# Patient Record
Sex: Male | Born: 1965 | Hispanic: No | Marital: Married | Smoking: Current every day smoker
Health system: Southern US, Community
[De-identification: ages and names within clinical notes are randomized; demographics above are authoritative.]

## PROBLEM LIST (undated history)

## (undated) DIAGNOSIS — L409 Psoriasis, unspecified: Secondary | ICD-10-CM

## (undated) HISTORY — PX: BACK SURGERY: SHX140

---

## 2020-06-10 ENCOUNTER — Other Ambulatory Visit: Payer: Self-pay

## 2020-06-10 ENCOUNTER — Other Ambulatory Visit (HOSPITAL_BASED_OUTPATIENT_CLINIC_OR_DEPARTMENT_OTHER): Payer: Self-pay | Admitting: Emergency Medicine

## 2020-06-10 ENCOUNTER — Emergency Department (HOSPITAL_BASED_OUTPATIENT_CLINIC_OR_DEPARTMENT_OTHER)
Admission: EM | Admit: 2020-06-10 | Discharge: 2020-06-10 | Disposition: A | Discharge status: Home / Self Care | Payer: 59 | Attending: Emergency Medicine | Admitting: Emergency Medicine

## 2020-06-10 ENCOUNTER — Emergency Department (HOSPITAL_BASED_OUTPATIENT_CLINIC_OR_DEPARTMENT_OTHER): Payer: 59

## 2020-06-10 ENCOUNTER — Encounter (HOSPITAL_BASED_OUTPATIENT_CLINIC_OR_DEPARTMENT_OTHER): Payer: Self-pay | Admitting: *Deleted

## 2020-06-10 DIAGNOSIS — F17210 Nicotine dependence, cigarettes, uncomplicated: Secondary | ICD-10-CM | POA: Insufficient documentation

## 2020-06-10 DIAGNOSIS — M5459 Other low back pain: Secondary | ICD-10-CM | POA: Diagnosis present

## 2020-06-10 DIAGNOSIS — N200 Calculus of kidney: Secondary | ICD-10-CM | POA: Diagnosis not present

## 2020-06-10 HISTORY — DX: Psoriasis, unspecified: L40.9

## 2020-06-10 LAB — URINALYSIS, ROUTINE W REFLEX MICROSCOPIC
Bilirubin Urine: NEGATIVE
Glucose, UA: NEGATIVE mg/dL
Ketones, ur: NEGATIVE mg/dL
Leukocytes,Ua: NEGATIVE
Nitrite: NEGATIVE
Protein, ur: NEGATIVE mg/dL
Specific Gravity, Urine: >1.03 — ABNORMAL HIGH (ref 1.005–1.030)
pH: 6 (ref 5.0–8.0)

## 2020-06-10 LAB — URINALYSIS, MICROSCOPIC (REFLEX)

## 2020-06-10 MED ORDER — HYDROCODONE-ACETAMINOPHEN 5-325 MG PO TABS: 1.0000 | ORAL_TABLET | Freq: Four times a day (QID) | ORAL | 0 refills | Status: DC | PRN

## 2020-06-10 MED ORDER — TAMSULOSIN HCL 0.4 MG PO CAPS
0.4000 mg | ORAL_CAPSULE | Freq: Every day | ORAL | 0 refills | Status: DC
Start: 2020-06-10 — End: 2020-06-10

## 2020-06-10 MED ORDER — KETOROLAC TROMETHAMINE 30 MG/ML IJ SOLN
15.0000 mg | Freq: Once | INTRAMUSCULAR | Status: DC
  Filled 2020-06-10: qty 1

## 2020-06-10 MED ORDER — TRAMADOL HCL 50 MG PO TABS
50.0000 mg | ORAL_TABLET | Freq: Once | ORAL | Status: AC
  Administered 2020-06-10: 50 mg via ORAL
  Filled 2020-06-10: qty 1

## 2020-06-10 MED ORDER — KETOROLAC TROMETHAMINE 30 MG/ML IJ SOLN
30.0000 mg | Freq: Once | INTRAMUSCULAR | Status: AC
  Administered 2020-06-10: 30 mg via INTRAMUSCULAR

## 2020-06-10 MED FILL — HYDROCODON-APAP 5-325: 5-325 | 2 days supply | Qty: 15 | Fill #0

## 2020-06-10 MED FILL — TAMSULOSIN HCL 0.4 MG CAP: 0.4 | 10 days supply | Qty: 10 | Fill #0

## 2020-06-10 NOTE — ED Notes (Signed)
ED Provider at bedside. 

## 2020-06-10 NOTE — ED Provider Notes (Signed)
[facility] EMERGENCY DEPARTMENT Provider Note   CSN: [id number] Arrival date & time: 06/10/20  7893     History Chief Complaint  Patient presents with  . Flank pain    James Warner is a 54 y.o. male.  Patient is a 54 year old male who presents with right lower back pain.  He said that he has had it for about a year intermittently.  It usually stays in his right lower back.  Since yesterday has been radiating a little bit more around to his flank area.  He describes it as a knife stabbing him in the back.  It is gotten much more intense since yesterday.  He denies any associated nausea or vomiting.  No abdominal pain other than the radiation to his flank.  No radiation down his legs.  This morning he did notice a little bit of numbness in his right foot but denies any numbness now.  No weakness in the legs.  No loss of bowel or bladder function.  No fevers.  No urinary symptoms although he says his urine is dark and has a bit of an odor.  He says its not really worse with movement.  He has been using Tylenol with some improvement in symptoms although since he got worse yesterday it has not really been helping.  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.  He originally is from [place].        Past Medical History:  Diagnosis Date  . Psoriasis     There are no problems to display for this patient.   Past Surgical History:  Procedure Laterality Date  . BACK SURGERY         History reviewed. No pertinent family history.  Social History   Tobacco Use  . Smoking status: Current Every Day Smoker    Types: Cigarettes  . Smokeless tobacco: Never Used  Substance Use Topics  . Alcohol use: Not Currently    Comment: quit 16 years ago  . Drug use: Never    Home Medications Prior to Admission medications   Medication Sig Start Date End Date Taking? Authorizing Provider  HYDROcodone-acetaminophen (NORCO/VICODIN) 5-325 MG  tablet Take 1-2 tablets by mouth every 6 (six) hours as needed. 06/10/20   Rolan Bucco, MD  tamsulosin (FLOMAX) 0.4 MG CAPS capsule Take 1 capsule (0.4 mg total) by mouth daily. 06/10/20   Rolan Bucco, MD    Allergies    Patient has no known allergies.  Review of Systems   Review of Systems  Constitutional: Negative for chills, diaphoresis, fatigue and fever.  HENT: Negative for congestion, rhinorrhea and sneezing.   Eyes: Negative.   Respiratory: Negative for cough, chest tightness and shortness of breath.   Cardiovascular: Negative for chest pain and leg swelling.  Gastrointestinal: Negative for abdominal pain, blood in stool, diarrhea, nausea and vomiting.  Genitourinary: Positive for flank pain. Negative for difficulty urinating, frequency and hematuria.  Musculoskeletal: Positive for back pain. Negative for arthralgias.  Skin: Negative for rash.  Neurological: Negative for dizziness, speech difficulty, weakness, numbness and headaches.    Physical Exam Updated Vital Signs BP 128/83 (BP Location: Right Arm)   Pulse 75   Temp 98.1 F (36.7 C) (Oral)   Resp 18   Ht 5\' 6"  (1.676 m)   Wt 74.8 kg   SpO2 98%   BMI 26.63 kg/m   Physical Exam Constitutional:      Appearance: He is well-developed.  HENT:     Head: Normocephalic and atraumatic.  Eyes:     Pupils: Pupils are equal, round, and reactive to light.  Cardiovascular:     Rate and Rhythm: Normal rate and regular rhythm.     Heart sounds: Normal heart sounds.  Pulmonary:     Effort: Pulmonary effort is normal. No respiratory distress.     Breath sounds: Normal breath sounds. No wheezing or rales.  Chest:     Chest wall: No tenderness.  Abdominal:     General: Bowel sounds are normal.     Palpations: Abdomen is soft.     Tenderness: There is no abdominal tenderness. There is no guarding or rebound.  Musculoskeletal:        General: Normal range of motion.     Cervical back: Normal range of motion and neck  supple.     Comments: No reproducible tenderness to the lumbar spine.  No pain to the musculature.  Negative straight leg raise bilaterally.  Motor 5 out of 5 all extremities, sensation grossly intact to light touch all extremities, pedal pulses are intact.  Lymphadenopathy:     Cervical: No cervical adenopathy.  Skin:    General: Skin is warm and dry.     Findings: No rash.  Neurological:     Mental Status: He is alert and oriented to person, place, and time.     ED Results / Procedures / Treatments   Labs (all labs ordered are listed, but only abnormal results are displayed) Labs Reviewed  URINALYSIS, ROUTINE W REFLEX MICROSCOPIC - Abnormal; Notable for the following components:      Result Value   Specific Gravity, Urine >1.030 (*)    Hgb urine dipstick SMALL (*)    All other components within normal limits  URINALYSIS, MICROSCOPIC (REFLEX) - Abnormal; Notable for the following components:   Bacteria, UA RARE (*)    All other components within normal limits    EKG None  Radiology CT Renal Stone Study  Result Date: 06/10/2020 CLINICAL DATA:  Right flank pain EXAM: CT ABDOMEN AND PELVIS WITHOUT CONTRAST TECHNIQUE: Multidetector CT imaging of the abdomen and pelvis was performed following the standard protocol without oral or IV contrast. COMPARISON:  None. FINDINGS: Lower chest: There is slight bibasilar atelectasis. Lung bases otherwise are clear. Hepatobiliary: No focal liver lesions are appreciable on this noncontrast enhanced study. Gallbladder wall is not appreciably thickened. There is no biliary duct dilatation. Pancreas: There is no pancreatic mass or inflammatory focus. Spleen: No splenic lesions are evident. Adrenals/Urinary Tract: Adrenals bilaterally appear normal. There is no appreciable renal mass or hydronephrosis on either side. There is no intrarenal calculus on either side. There is a tiny calculus in the proximal right ureter at the L3-4 level, seen on axial slice  41 series 2. There is no other evident ureteral calculus. Urinary bladder is midline with wall thickness within normal limits. Stomach/Bowel: There are scattered sigmoid diverticula without diverticulitis. There is no bowel wall or mesenteric thickening. The terminal ileum appears normal. No evident bowel obstruction. There is no appreciable free air or portal venous air. Vascular/Lymphatic: There is no abdominal aortic aneurysm. No vascular lesion evident on noncontrast enhanced study. There is no evident adenopathy in the abdomen or pelvis. Reproductive: Prostate and seminal vesicles are normal in size and contour. Other: The appendix appears normal. There is no evident abscess or ascites in the abdomen or pelvis. There is minimal fat in the umbilicus. Musculoskeletal: There is postoperative change  at L4 and L5. There are no blastic or lytic bone lesions. No intramuscular or abdominal wall lesions are evident. IMPRESSION: 1. Tiny calculus in the proximal right ureter without appreciable hydronephrosis. No intrarenal calculi evident. Urinary bladder wall thickness within normal limits. 2. Occasional sigmoid diverticula without diverticulitis. No bowel obstruction. No abscess in the abdomen or pelvis. Appendix appears normal. 3.  Postoperative changes at L4 and L5. Electronically Signed   By: Bretta Bang III M.D.   On: 06/10/2020 09:44    Procedures Procedures (including critical care time)  Medications Ordered in ED Medications  traMADol (ULTRAM) tablet 50 mg (50 mg Oral Given 06/10/20 0901)  ketorolac (TORADOL) 30 MG/ML injection 30 mg (30 mg Intramuscular Given 06/10/20 1028)    ED Course  I have reviewed the triage vital signs and the nursing notes.  Pertinent labs & imaging results that were available during my care of the patient were reviewed by me and considered in my medical decision making (see chart for details).    MDM Rules/Calculators/A&P                          Patient  presents with right flank pain.  He has had some intermittent pain in his right back for about a year.  However the last couple of days he has had more severe pain which is different than his typical pain and radiates to his flank area.  CT scan shows a stone in the right ureter which is likely the etiology of his symptoms.  His ongoing intermittent back pain may be more musculoskeletal although the pain today seems to be more consistent with renal colic.  His urine does not appear to be infected.  It is a nonobstructing stone and given the fact it is started yesterday, I do not feel that he needs other lab work.  He is otherwise well-appearing.  He was discharged home in good condition.  He was given a prescription for Flomax and a short course of hydrocodone.  He was advised not to use the hydrocodone while working.  He was given a referral to follow-up with [facility].  Return precautions were given. Final Clinical Impression(s) / ED Diagnoses Final diagnoses:  Kidney stone    Rx / DC Orders ED Discharge Orders         Ordered    tamsulosin (FLOMAX) 0.4 MG CAPS capsule  Daily        06/10/20 1041    HYDROcodone-acetaminophen (NORCO/VICODIN) 5-325 MG tablet  Every 6 hours PRN        06/10/20 1041           Rolan Bucco, MD 06/10/20 1044

## 2020-06-10 NOTE — ED Triage Notes (Signed)
Right flank pain for 6 months, he had been applying a pain ointment medicine with relief.  Now, the pain is getting worst with no relief from the pain ointment and pain medicine.

## 2020-06-18 ENCOUNTER — Other Ambulatory Visit: Payer: Self-pay

## 2020-06-18 ENCOUNTER — Emergency Department (HOSPITAL_BASED_OUTPATIENT_CLINIC_OR_DEPARTMENT_OTHER): Payer: Managed Care, Other (non HMO)

## 2020-06-18 ENCOUNTER — Encounter (HOSPITAL_BASED_OUTPATIENT_CLINIC_OR_DEPARTMENT_OTHER): Payer: Self-pay | Admitting: Emergency Medicine

## 2020-06-18 ENCOUNTER — Other Ambulatory Visit (HOSPITAL_BASED_OUTPATIENT_CLINIC_OR_DEPARTMENT_OTHER): Payer: Self-pay | Admitting: Emergency Medicine

## 2020-06-18 ENCOUNTER — Emergency Department (HOSPITAL_BASED_OUTPATIENT_CLINIC_OR_DEPARTMENT_OTHER)
Admission: EM | Admit: 2020-06-18 | Discharge: 2020-06-18 | Disposition: A | Discharge status: Home / Self Care | Payer: Managed Care, Other (non HMO) | Attending: Emergency Medicine | Admitting: Emergency Medicine

## 2020-06-18 DIAGNOSIS — M545 Low back pain, unspecified: Secondary | ICD-10-CM | POA: Diagnosis not present

## 2020-06-18 DIAGNOSIS — R109 Unspecified abdominal pain: Secondary | ICD-10-CM | POA: Diagnosis present

## 2020-06-18 DIAGNOSIS — F17210 Nicotine dependence, cigarettes, uncomplicated: Secondary | ICD-10-CM | POA: Insufficient documentation

## 2020-06-18 LAB — URINALYSIS, ROUTINE W REFLEX MICROSCOPIC
Bilirubin Urine: NEGATIVE
Glucose, UA: NEGATIVE mg/dL
Ketones, ur: NEGATIVE mg/dL
Leukocytes,Ua: NEGATIVE
Nitrite: NEGATIVE
Protein, ur: NEGATIVE mg/dL
Specific Gravity, Urine: 1.02 (ref 1.005–1.030)
pH: 6 (ref 5.0–8.0)

## 2020-06-18 LAB — CBC WITH DIFFERENTIAL/PLATELET
Abs Immature Granulocytes: 0.08 10*3/uL — ABNORMAL HIGH (ref 0.00–0.07)
Basophils Absolute: 0.1 10*3/uL (ref 0.0–0.1)
Basophils Relative: 1 %
Eosinophils Absolute: 0.3 10*3/uL (ref 0.0–0.5)
Eosinophils Relative: 3 %
HCT: 47.9 % (ref 39.0–52.0)
Hemoglobin: 15.9 g/dL (ref 13.0–17.0)
Immature Granulocytes: 1 %
Lymphocytes Relative: 27 %
Lymphs Abs: 3.1 10*3/uL (ref 0.7–4.0)
MCH: 31.3 pg (ref 26.0–34.0)
MCHC: 33.2 g/dL (ref 30.0–36.0)
MCV: 94.3 fL (ref 80.0–100.0)
Monocytes Absolute: 0.7 10*3/uL (ref 0.1–1.0)
Monocytes Relative: 6 %
Neutro Abs: 7.2 10*3/uL (ref 1.7–7.7)
Neutrophils Relative %: 62 %
Platelets: 363 10*3/uL (ref 150–400)
RBC: 5.08 MIL/uL (ref 4.22–5.81)
RDW: 13 % (ref 11.5–15.5)
WBC: 11.5 10*3/uL — ABNORMAL HIGH (ref 4.0–10.5)
nRBC: 0 % (ref 0.0–0.2)

## 2020-06-18 LAB — COMPREHENSIVE METABOLIC PANEL
ALT: 35 U/L (ref 0–44)
AST: 25 U/L (ref 15–41)
Albumin: 4.4 g/dL (ref 3.5–5.0)
Alkaline Phosphatase: 71 U/L (ref 38–126)
Anion gap: 10 (ref 5–15)
BUN: 14 mg/dL (ref 6–20)
CO2: 28 mmol/L (ref 22–32)
Calcium: 9.4 mg/dL (ref 8.9–10.3)
Chloride: 101 mmol/L (ref 98–111)
Creatinine, Ser: 1.02 mg/dL (ref 0.61–1.24)
GFR, Estimated: >60 mL/min (ref >60)
Glucose, Bld: 128 mg/dL — ABNORMAL HIGH (ref 70–99)
Potassium: 4.3 mmol/L (ref 3.5–5.1)
Sodium: 139 mmol/L (ref 135–145)
Total Bilirubin: 0.6 mg/dL (ref 0.3–1.2)
Total Protein: 7.5 g/dL (ref 6.5–8.1)

## 2020-06-18 LAB — URINALYSIS, MICROSCOPIC (REFLEX)

## 2020-06-18 MED ORDER — KETOROLAC TROMETHAMINE 30 MG/ML IJ SOLN
30.0000 mg | Freq: Once | INTRAMUSCULAR | Status: AC
  Administered 2020-06-18: 30 mg via INTRAVENOUS
  Filled 2020-06-18: qty 1

## 2020-06-18 MED ORDER — METHOCARBAMOL 500 MG PO TABS: 500.0000 mg | ORAL_TABLET | Freq: Two times a day (BID) | ORAL | 0 refills | Status: DC

## 2020-06-18 MED ORDER — PREDNISONE 20 MG PO TABS: 40.0000 mg | ORAL_TABLET | Freq: Every day | ORAL | 0 refills | Status: DC

## 2020-06-18 MED FILL — predniSONE 20 MG TABS: 20 | 4 days supply | Qty: 8 | Fill #0

## 2020-06-18 MED FILL — METHOCARBAMOL 500 MG TABS: 500 | 10 days supply | Qty: 20 | Fill #0

## 2020-06-18 NOTE — ED Triage Notes (Signed)
Pt reports right lower flank pain with lower back pain that is not responding to recent rx pain medication. Pt ambulatory to room, endorses worsening pain. Pt reports recent dx for kidney stone. Pt denies dysuria

## 2020-06-18 NOTE — Discharge Instructions (Addendum)
Your workup today looked reassuring.   Your CT scan showed that your kidney stone has passed.   We will plan to treat your back pain with muscle relaxers and prednisone. Take as directed.   Take Robaxin as prescribed. This medication will make you drowsy so do not drive or drink alcohol when taking it.  Follow-up with [facility] to establish a primary care doctor if you do not have one.   Return to the Emergency Department immediately for any worsening back pain, neck pain, difficulty walking, numbness/weaknss of your arms or legs, urinary or bowel accidents, fever or any other worsening or concerning symptoms.

## 2020-06-18 NOTE — ED Provider Notes (Signed)
[facility] EMERGENCY DEPARTMENT Provider Note   CSN: [id number] Arrival date & time: 06/18/20  7425     History Chief Complaint  Patient presents with  . Flank Pain    James Warner is a 54 y.o. male with PMH/o Psoriasis who presents for evaluation of right sided back pain. He was seen here on 06/10/20 for evaluation of flank pain and was diagnosed with a right sided kidney stone at that time. He was discharged home with hydrocodone which he states he has been taking. He reports that despite the meds, he has continued to have pain. He does not have any more pain medications left so he came to the ER for further evaluation. He states that the pain is only on the right side of his lower back and moves around. It does not radiate to his abdomen. The pain is worse when moves, bends or changes position. He has noted any fevers, dysuria, hematuria, nausea/vomiting. He has had a history of back surgery several years ago. Denies fevers, weight loss, numbness/weakness of upper and lower extremities, bowel/bladder incontinence, saddle anesthesia.   The history is provided by the patient.       Past Medical History:  Diagnosis Date  . Psoriasis     There are no problems to display for this patient.   Past Surgical History:  Procedure Laterality Date  . BACK SURGERY         History reviewed. No pertinent family history.  Social History   Tobacco Use  . Smoking status: Current Every Day Smoker    Types: Cigarettes  . Smokeless tobacco: Never Used  Substance Use Topics  . Alcohol use: Not Currently    Comment: quit 16 years ago  . Drug use: Never    Home Medications Prior to Admission medications   Medication Sig Start Date End Date Taking? Authorizing Provider  HYDROcodone-acetaminophen (NORCO/VICODIN) 5-325 MG tablet Take 1-2 tablets by mouth every 6 (six) hours as needed. 06/10/20   Rolan Bucco, MD  methocarbamol (ROBAXIN) 500 MG tablet Take 1 tablet (500 mg  total) by mouth 2 (two) times daily. 06/18/20   Maxwell Caul, PA-C  predniSONE (DELTASONE) 20 MG tablet Take 2 tablets (40 mg total) by mouth daily for 4 days. 06/18/20 06/22/20  Maxwell Caul, PA-C  tamsulosin (FLOMAX) 0.4 MG CAPS capsule Take 1 capsule (0.4 mg total) by mouth daily. 06/10/20   Rolan Bucco, MD    Allergies    Patient has no known allergies.  Review of Systems   Review of Systems  Constitutional: Negative for fever.  Respiratory: Negative for shortness of breath.   Cardiovascular: Negative for chest pain.  Gastrointestinal: Negative for abdominal pain, nausea and vomiting.  Genitourinary: Negative for dysuria and hematuria.  Musculoskeletal: Positive for back pain.  Neurological: Negative for headaches.  All other systems reviewed and are negative.   Physical Exam Updated Vital Signs BP 97/81   Pulse (!) 58   Temp 98 F (36.7 C) (Oral)   Resp 18   Ht 5\' 6"  (1.676 m)   Wt 74.8 kg   SpO2 100%   BMI 26.63 kg/m   Physical Exam Vitals and nursing note reviewed.  Constitutional:      Appearance: Normal appearance. He is well-developed.     Comments: Sitting comfortably on examination table  HENT:     Head: Normocephalic and atraumatic.  Eyes:     General: Lids are normal.     Conjunctiva/sclera: Conjunctivae normal.  Pupils: Pupils are equal, round, and reactive to light.  Cardiovascular:     Rate and Rhythm: Normal rate and regular rhythm.     Pulses: Normal pulses.     Heart sounds: Normal heart sounds. No murmur heard.  No friction rub. No gallop.   Pulmonary:     Effort: Pulmonary effort is normal.     Breath sounds: Normal breath sounds.     Comments: Lungs clear to auscultation bilaterally.  Symmetric chest rise.  No wheezing, rales, rhonchi. Abdominal:     Palpations: Abdomen is soft. Abdomen is not rigid.     Tenderness: There is no abdominal tenderness. There is no right CVA tenderness, left CVA tenderness or guarding.      Comments: Abdomen is soft, non-distended, non-tender. No rigidity, No guarding. No peritoneal signs. No CVA tenderness bilaterally.  Musculoskeletal:        General: Normal range of motion.     Cervical back: Full passive range of motion without pain.       Back:     Comments: No midline T or L spine tenderness. Tenderness noted to the right paraspinal muscles of the lower lumbar region.   Skin:    General: Skin is warm and dry.     Capillary Refill: Capillary refill takes less than 2 seconds.  Neurological:     Mental Status: He is alert and oriented to person, place, and time.     Comments: 5/5 strength BUE and BLE Normal gait  Psychiatric:        Speech: Speech normal.     ED Results / Procedures / Treatments   Labs (all labs ordered are listed, but only abnormal results are displayed) Labs Reviewed  COMPREHENSIVE METABOLIC PANEL - Abnormal; Notable for the following components:      Result Value   Glucose, Bld 128 (*)    All other components within normal limits  CBC WITH DIFFERENTIAL/PLATELET - Abnormal; Notable for the following components:   WBC 11.5 (*)    Abs Immature Granulocytes 0.08 (*)    All other components within normal limits  URINALYSIS, ROUTINE W REFLEX MICROSCOPIC - Abnormal; Notable for the following components:   Hgb urine dipstick SMALL (*)    All other components within normal limits  URINALYSIS, MICROSCOPIC (REFLEX) - Abnormal; Notable for the following components:   Bacteria, UA RARE (*)    All other components within normal limits    EKG None  Radiology CT Renal Stone Study  Result Date: 06/18/2020 CLINICAL DATA:  Right nephrolithiasis, persistent right flank pain EXAM: CT ABDOMEN AND PELVIS WITHOUT CONTRAST TECHNIQUE: Multidetector CT imaging of the abdomen and pelvis was performed following the standard protocol without IV contrast. COMPARISON:  06/10/2020 FINDINGS: Lower chest: The visualized lung bases are clear bilaterally. The visualized  heart and pericardium are unremarkable. Hepatobiliary: No focal liver abnormality is seen. No gallstones, gallbladder wall thickening, or biliary dilatation. Pancreas: Unremarkable Spleen: Unremarkable Adrenals/Urinary Tract: The adrenal glands are unremarkable. The kidneys are normal in size and position. Previously noted punctate calculus within the proximal right ureter has resolved, presumably passed in the interval, and there is no residual nephro or urolithiasis identified. No hydronephrosis. No significant perinephric stranding or perinephric fluid collections. The bladder is unremarkable peer Stomach/Bowel: Stomach, small bowel, and large bowel are unremarkable. No free intraperitoneal gas or fluid. Vascular/Lymphatic: No significant vascular findings are present. No enlarged abdominal or pelvic lymph nodes. Reproductive: Prostate is unremarkable. Other: Tiny fat containing right inguinal and  umbilical hernias are unchanged. Rectum unremarkable. Musculoskeletal: L4-5 lumbar fusion with instrumentation has been performed. No acute bone abnormality. IMPRESSION: Interval passage of right ureteral calculus. No residual nephro or urolithiasis. No definite radiographic explanation for the patient's reported symptoms. Electronically Signed   By: Helyn NumbersAshesh  Parikh MD   On: 06/18/2020 11:09    Procedures Procedures (including critical care time)  Medications Ordered in ED Medications  ketorolac (TORADOL) 30 MG/ML injection 30 mg (30 mg Intravenous Given 06/18/20 1102)    ED Course  I have reviewed the triage vital signs and the nursing notes.  Pertinent labs & imaging results that were available during my care of the patient were reviewed by me and considered in my medical decision making (see chart for details).  Clinical Course as of Jun 18 1320  Tue Jun 18, 2020  [id number] 54 year old male from [place] here for work.  Complaining of worsening right lower back pain.  Recently here for kidney stone which  has now passed.  Pain continues.  Prior lumbar fusion with similar pain.  No neurologic symptoms currently.  We will treat symptomatically with some anti-inflammatories muscle relaxants and have him follow-up with his spine doctor.    [MB]    Clinical Course User Index [MB] Terrilee FilesButler, Michael C, MD   MDM Rules/Calculators/A&P                          54 year old male who presents for evaluation of right-sided back pain.  Seen here on 06/10/2020 for evaluation of right-sided flank pain.  Was diagnosed with a kidney stone.  Discharged home with hydrocodone.  Comes in today because he states he has continued to have pain despite taking pain medications.  He states pain is worse when he moves.  No fevers, dysuria, hematuria, nausea/vomiting.  On initial arrival, he is afebrile nontoxic-appearing.  He is sitting comfortably on bed with no signs of distress.  Vital signs are stable.  On exam, no abdominal tenderness.  No CVA tenderness.  He has tenderness noted to the right paraspinal muscles of the lower lumbar region.  This pain is worse when he moves.  I suspect this is most likely musculoskeletal pain.  Reviewed his chart and I does see that he has had intermittent back pain for several years.  Patient is concerned that this is continued kidney stone as he is not seen at past at home.  He is not having any urinary symptoms, nausea/vomiting, fevers.  Additionally, he has no abdominal tenderness and no true CVA tenderness.  I suspect this is most likely more musculoskeletal than kidney stone but given recent history, will plan to check labs, urine.  Do not suspect cauda equina, spinal abscess.  He does have history of back surgery several years ago.  He has not any fever, urinary or bowel habits, numbness/weakness.  Usually, he has had no difficulty ambulating.  Review of his chart shows that from his visit on 06/10/2020, urine showed hemoglobin.  No infectious signs.  CT scan showed tiny calculus in proximal  right ureter without appreciable hydronephrosis.  UA shows small hemoglobin.  No infectious etiology.  CBC shows slight leukocytosis of 11.5.  BUN and creatinine within normal limits.  CT scan shows previously noted punctate calculus within the proximal right ureter has resolved.  No evidence of residual nephro urolithiasis identified.  No hydronephrosis.  No acute findings.  Discussed results with patient.  I discussed with him regarding his reassuring CT  scan findings.  I suspect this is more musculoskeletal related to his history of chronic back pain.  At this time, no emergent imaging indicated.  He has normal gait, no fever, no neuro deficits noted here on exam.  We will plan to treat him with muscle relaxers and a small course of prednisone for symptomatic relief.  Patient instructed to follow-up with his doctor.  He was also provided with outpatient: [facility]. At this time, patient exhibits no emergent life-threatening condition that require further evaluation in ED. Discussed patient with Dr. Charm Barges who is agreeable to plan. Patient had ample opportunity for questions and discussion. All patient's questions were answered with full understanding. Strict return precautions discussed. Patient expresses understanding and agreement to plan.   Portions of this note were generated with Scientist, clinical (histocompatibility and immunogenetics). Dictation errors may occur despite best attempts at proofreading.  Final Clinical Impression(s) / ED Diagnoses Final diagnoses:  Acute right-sided low back pain, unspecified whether sciatica present    Rx / DC Orders ED Discharge Orders         Ordered    methocarbamol (ROBAXIN) 500 MG tablet  2 times daily        06/18/20 1150    predniSONE (DELTASONE) 20 MG tablet  Daily        06/18/20 1150           Rosana Hoes 06/18/20 1321    Terrilee Files, MD 06/18/20 1753

## 2021-05-05 IMAGING — CT CT RENAL STONE PROTOCOL
2 of 4 series · 15 of 46 positions shown, 17 images · non-contrast
Comparison: None.

CLINICAL DATA: Right flank pain

EXAM:
CT ABDOMEN AND PELVIS WITHOUT CONTRAST
TECHNIQUE: Multidetector CT imaging of the abdomen and pelvis was performed
following the standard protocol without oral or IV contrast.

[Series 2: axial st · axial · 0.83mm/px · z∈[-491,-36]mm · 12 of 101 slices shown, 14 images]
[im 5/101  soft-tissue]
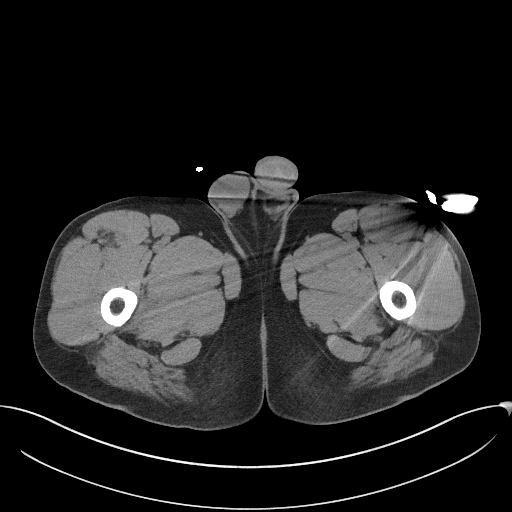
[im 5/101  bone]
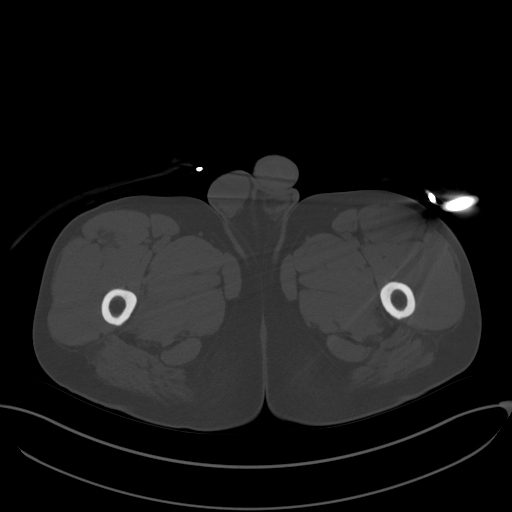
[im 13/101  soft-tissue]
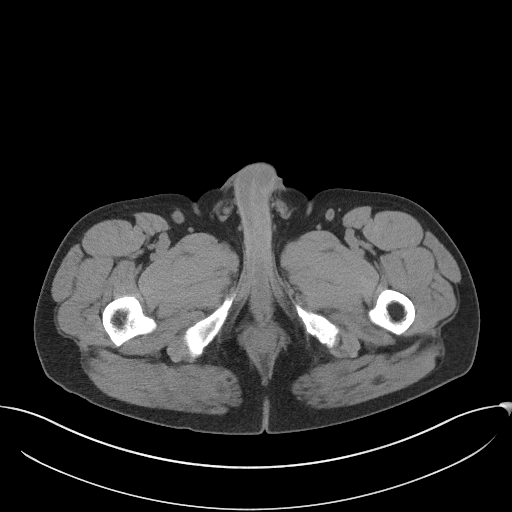
[im 21/101  soft-tissue]
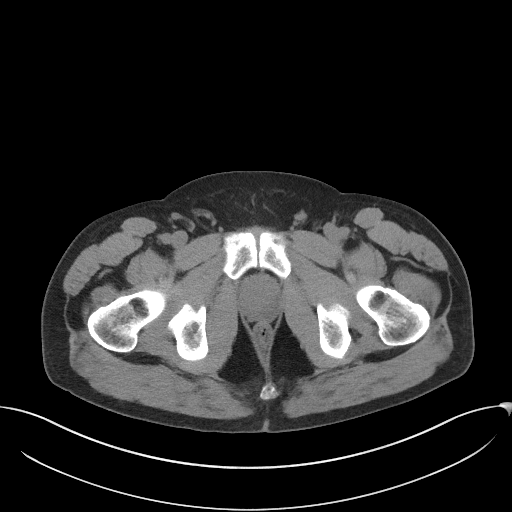
[im 30/101  soft-tissue]
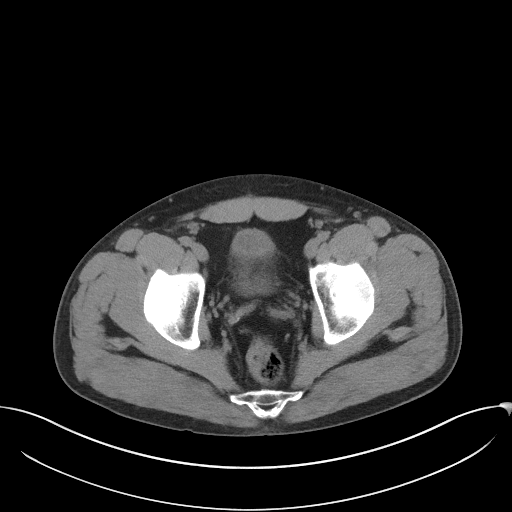
[im 38/101  soft-tissue]
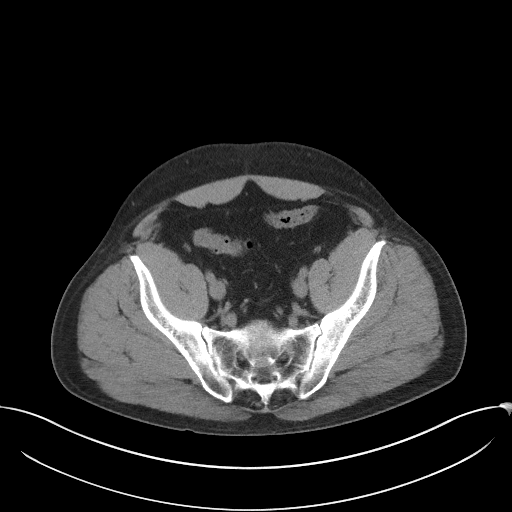
[im 46/101  soft-tissue]
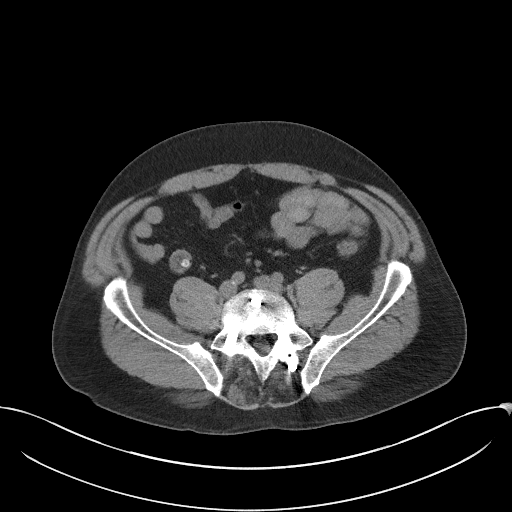
[im 55/101  soft-tissue]
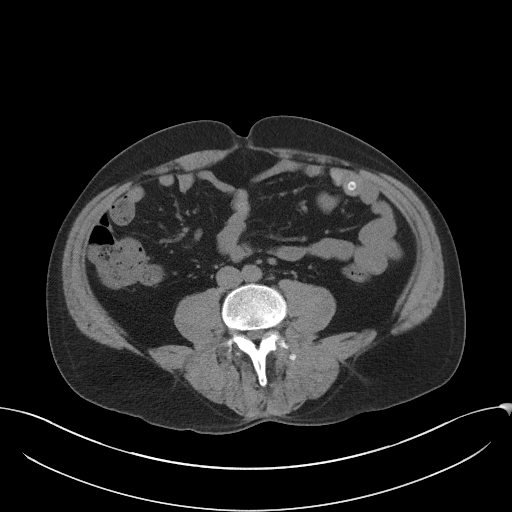
[im 63/101  soft-tissue]
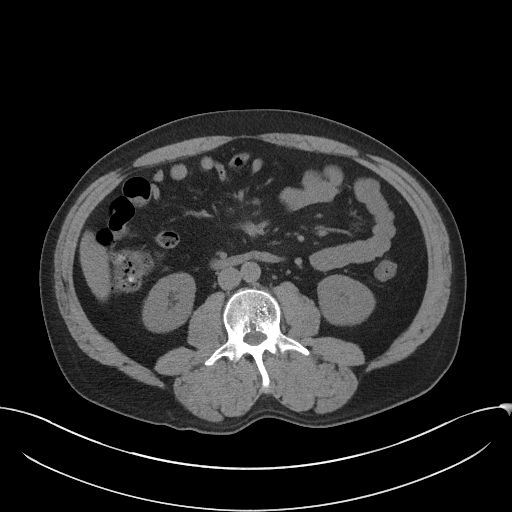
[im 71/101  soft-tissue]
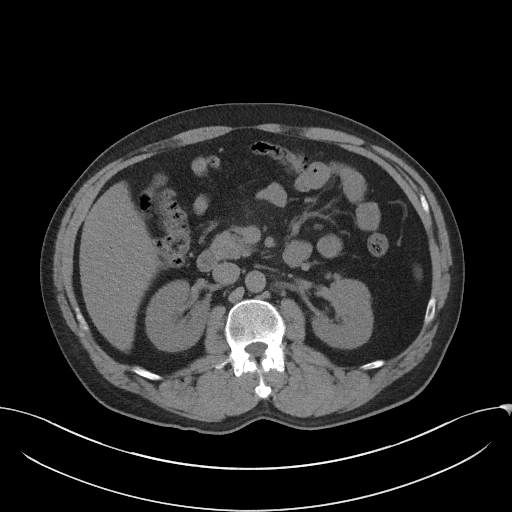
[im 71/101  bone]
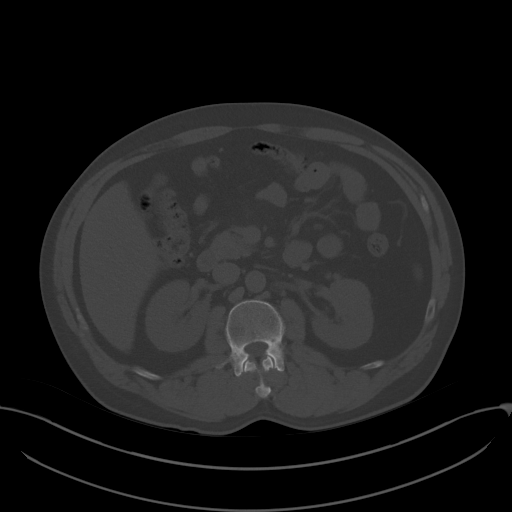
[im 80/101  soft-tissue]
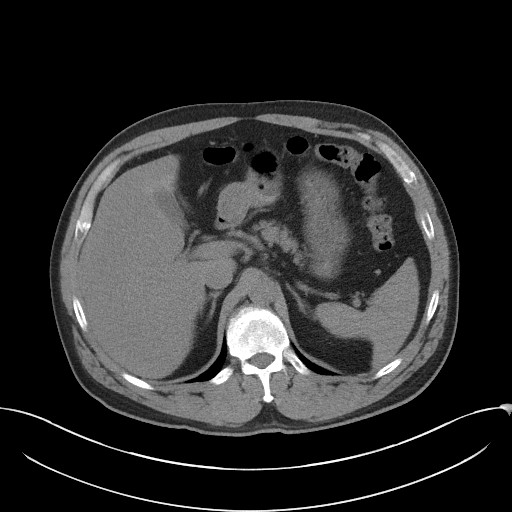
[im 88/101  soft-tissue]
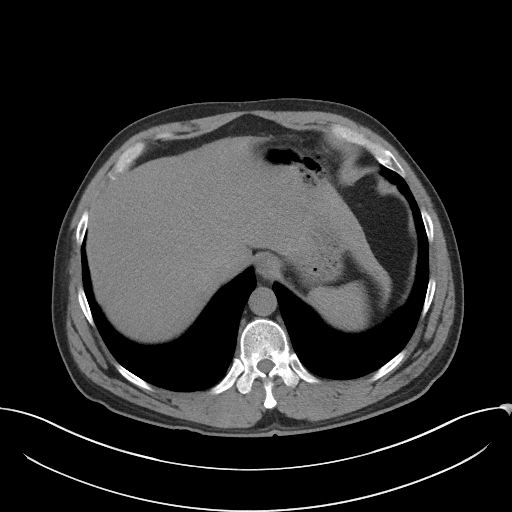
[im 96/101  soft-tissue]
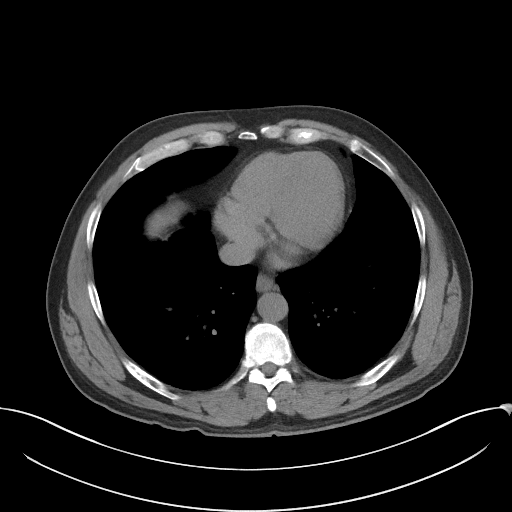

[Series 5: coronal st · coronal · 0.78mm/px · 3 of 96 slices shown]
[im 32/96  soft-tissue]
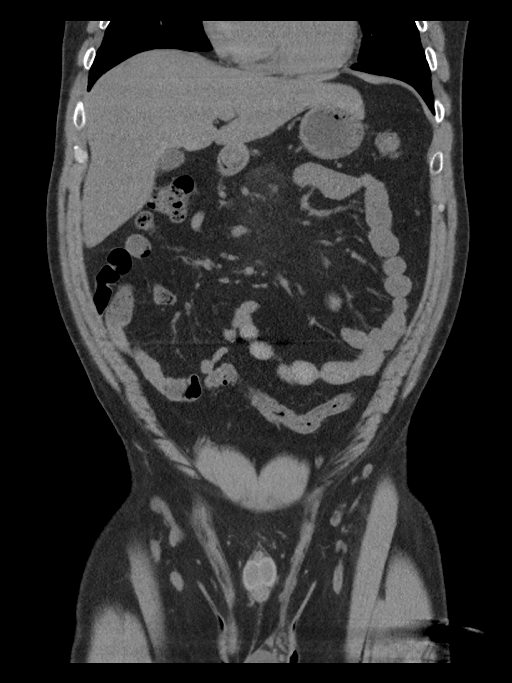
[im 43/96  soft-tissue]
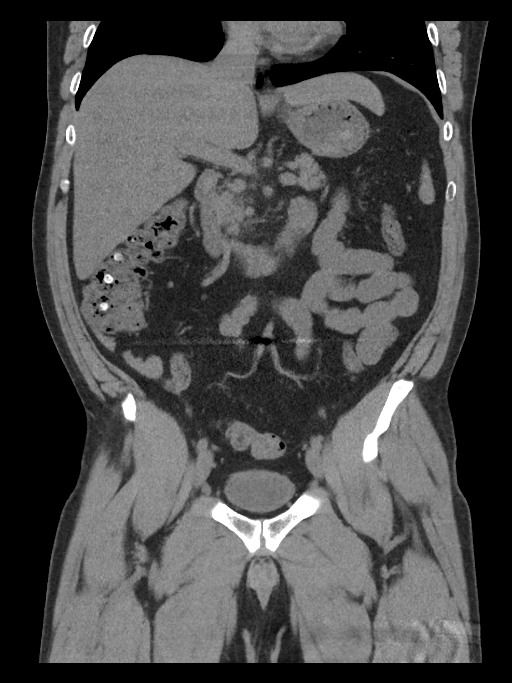
[im 53/96  soft-tissue]
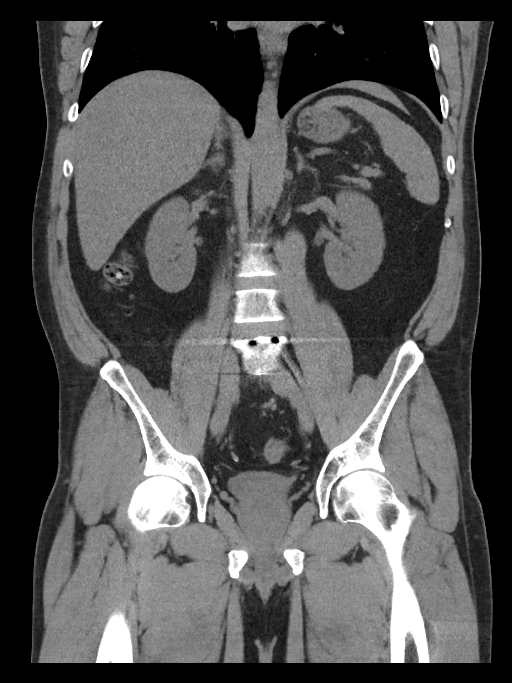

[15 of 46 positions shown; findings below may reference images not displayed]

FINDINGS: Lower chest: There is slight bibasilar atelectasis. Lung bases
otherwise are clear.

Hepatobiliary: No focal liver lesions are appreciable on this
noncontrast enhanced study. Gallbladder wall is not appreciably
thickened. There is no biliary duct dilatation.

Pancreas: There is no pancreatic mass or inflammatory focus.

Spleen: No splenic lesions are evident.

Adrenals/Urinary Tract: Adrenals bilaterally appear normal. There is
no appreciable renal mass or hydronephrosis on either side. There is
no intrarenal calculus on either side. There is a tiny calculus in
the proximal right ureter at the L3-4 level, seen on axial slice 41
series 2. There is no other evident ureteral calculus. Urinary
bladder is midline with wall thickness within normal limits.

Stomach/Bowel: There are scattered sigmoid diverticula without
diverticulitis. There is no bowel wall or mesenteric thickening. The
terminal ileum appears normal. No evident bowel obstruction. There
is no appreciable free air or portal venous air.

Vascular/Lymphatic: There is no abdominal aortic aneurysm. No
vascular lesion evident on noncontrast enhanced study. There is no
evident adenopathy in the abdomen or pelvis.

Reproductive: Prostate and seminal vesicles are normal in size and
contour.

Other: The appendix appears normal. There is no evident abscess or
ascites in the abdomen or pelvis. There is minimal fat in the
umbilicus.

Musculoskeletal: There is postoperative change at L4 and L5. There
are no blastic or lytic bone lesions. No intramuscular or abdominal
wall lesions are evident.
IMPRESSION: 1. Tiny calculus in the proximal right ureter without appreciable
hydronephrosis. No intrarenal calculi evident. Urinary bladder wall
thickness within normal limits.

2. Occasional sigmoid diverticula without diverticulitis. No bowel
obstruction. No abscess in the abdomen or pelvis. Appendix appears
normal.

3.  Postoperative changes at L4 and L5.
